# Patient Record
Sex: Male | Born: 1970 | Race: Black or African American | Hispanic: No | Marital: Single | Smoking: Never smoker
Health system: Southern US, Community
[De-identification: ages and names within clinical notes are randomized; demographics above are authoritative.]

## PROBLEM LIST (undated history)

## (undated) DIAGNOSIS — I10 Essential (primary) hypertension: Secondary | ICD-10-CM

---

## 2019-02-19 ENCOUNTER — Other Ambulatory Visit: Payer: Self-pay

## 2019-02-19 ENCOUNTER — Emergency Department: Payer: Medicaid Other

## 2019-02-19 ENCOUNTER — Emergency Department
Admission: EM | Admit: 2019-02-19 | Discharge: 2019-02-19 | Disposition: A | Discharge status: Home / Self Care | Payer: Medicaid Other | Attending: Student | Admitting: Student

## 2019-02-19 DIAGNOSIS — I10 Essential (primary) hypertension: Secondary | ICD-10-CM | POA: Insufficient documentation

## 2019-02-19 DIAGNOSIS — M545 Low back pain, unspecified: Secondary | ICD-10-CM

## 2019-02-19 HISTORY — DX: Essential (primary) hypertension: I10

## 2019-02-19 LAB — URINALYSIS, COMPLETE (UACMP) WITH MICROSCOPIC
Bacteria, UA: NONE SEEN
Bilirubin Urine: NEGATIVE
Glucose, UA: NEGATIVE mg/dL
Hgb urine dipstick: NEGATIVE
Ketones, ur: NEGATIVE mg/dL
Leukocytes,Ua: NEGATIVE
Nitrite: NEGATIVE
Protein, ur: NEGATIVE mg/dL
Specific Gravity, Urine: 1.025 (ref 1.005–1.030)
Squamous Epithelial / HPF: NONE SEEN (ref 0–5)
pH: 7 (ref 5.0–8.0)

## 2019-02-19 MED ORDER — MELOXICAM 15 MG PO TABS: 15.0000 mg | ORAL_TABLET | Freq: Every day | ORAL | 2 refills | Status: AC

## 2019-02-19 MED ORDER — BACLOFEN 10 MG PO TABS: 10.0000 mg | ORAL_TABLET | Freq: Every day | ORAL | 1 refills | Status: AC

## 2019-02-19 MED ORDER — CYCLOBENZAPRINE HCL 10 MG PO TABS
10.0000 mg | ORAL_TABLET | Freq: Once | ORAL | Status: AC
  Administered 2019-02-19: 10 mg via ORAL
  Filled 2019-02-19: qty 1

## 2019-02-19 MED ORDER — IBUPROFEN 600 MG PO TABS
600.0000 mg | ORAL_TABLET | Freq: Once | ORAL | Status: AC
  Administered 2019-02-19: 600 mg via ORAL
  Filled 2019-02-19: qty 1

## 2019-02-19 NOTE — ED Provider Notes (Signed)
[facility] Emergency Department Provider Note  ____________________________________________   First MD Initiated Contact with Patient 02/19/19 1013     (approximate)  I have reviewed the triage vital signs and the nursing notes.   HISTORY  Chief Complaint Back Pain    HPI Duane Cook is a 48 y.o. male presents emergency department from a group home.  Group home dropped him off and representative was called by the nursing staff.  They state he has been complaining of back pain and keeps rubbing at his kidney area.  They have been giving him Tylenol for pain as needed but is not helping.  The patient himself shows me that he hurts in his lower back and right flank area.  No fever or chills.  No vomiting or diarrhea.  Unsure of how long symptoms have been present patient states it has been approximately 3 months.    Past Medical History:  Diagnosis Date  . Hypertension     There are no active problems to display for this patient.   History reviewed. No pertinent surgical history.  Prior to Admission medications   Medication Sig Start Date End Date Taking? Authorizing Provider  benztropine (COGENTIN) 1 MG tablet Take 1 mg by mouth 2 (two) times daily.   Yes [provider]  divalproex (DEPAKOTE) 500 MG DR tablet Take 500 mg by mouth 3 (three) times daily.   Yes [provider]  docusate sodium (COLACE) 100 MG capsule Take 100 mg by mouth 2 (two) times daily.   Yes [provider]  haloperidol (HALDOL) 10 MG tablet Take 10 mg by mouth 4 (four) times daily.   Yes [provider]  traZODone (DESYREL) 100 MG tablet Take 100 mg by mouth at bedtime.   Yes [provider]  baclofen (LIORESAL) 10 MG tablet Take 1 tablet (10 mg total) by mouth daily. Prn muscle spasm 02/19/19 02/19/20  Caryn Section Linden Dolin, PA-C  meloxicam (MOBIC) 15 MG tablet Take 1 tablet (15 mg total) by mouth daily. Prn back pain 02/19/19 02/19/20   Caryn Section Linden Dolin, PA-C    Allergies Patient has no known allergies.  No family history on file.  Social History Social History   Tobacco Use  . Smoking status: Never Smoker  . Smokeless tobacco: Never Used  Substance Use Topics  . Alcohol use: Not Currently  . Drug use: Not Currently    Review of Systems  Constitutional: No fever/chills Eyes: No visual changes. ENT: No sore throat. Respiratory: Denies cough Genitourinary: Negative for dysuria. Musculoskeletal: Positive for back pain. Skin: Negative for rash.    ____________________________________________   PHYSICAL EXAM:  VITAL SIGNS: ED Triage Vitals  Enc Vitals Group     BP 02/19/19 0936 (!) 154/82     Pulse Rate 02/19/19 0936 99     Resp 02/19/19 0936 17     Temp 02/19/19 0936 98.3 F (36.8 C)     Temp Source 02/19/19 0936 Oral     SpO2 02/19/19 0936 98 %     Weight 02/19/19 0937 206 lb (93.4 kg)     Height 02/19/19 0937 6' (1.829 m)     Head Circumference --      Peak Flow --      Pain Score 02/19/19 0937 10     Pain Loc --      Pain Edu? --      Excl. in [place]? --     Constitutional: Alert and oriented. Well appearing and  in no acute distress. Eyes: Conjunctivae are normal.  Head: Atraumatic. Nose: No congestion/rhinnorhea. Mouth/Throat: Mucous membranes are moist.   Neck:  supple no lymphadenopathy noted Cardiovascular: Normal rate, regular rhythm. Heart sounds are normal Respiratory: Normal respiratory effort.  No retractions, lungs c t a  Abd: soft nontender bs normal all 4 quad, right-sided CVA tenderness GU: deferred Musculoskeletal: FROM all extremities, warm and well perfused, paravertebral muscles of the lumbar spine are tender, spiny processes are mildly tender, neurovascular is intact, patient is able stand and walk without difficulty Neurologic:  Normal speech and language.  Skin:  Skin is warm, dry and intact. No rash noted. Psychiatric: Mood and affect are normal. Speech and behavior  are normal.  ____________________________________________   LABS (all labs ordered are listed, but only abnormal results are displayed)  Labs Reviewed  URINALYSIS, COMPLETE (UACMP) WITH MICROSCOPIC - Abnormal; Notable for the following components:      Result Value   Color, Urine YELLOW (*)    APPearance CLEAR (*)    All other components within normal limits   ____________________________________________   ____________________________________________  RADIOLOGY  X-ray of the lumbar spine is negative for fracture or disc narrowing  ____________________________________________   PROCEDURES  Procedure(s) performed: Ibuprofen 600 mg p.o., Flexeril 10 mg p.o.   Procedures    ____________________________________________   INITIAL IMPRESSION / ASSESSMENT AND PLAN / ED COURSE  Pertinent labs & imaging results that were available during my care of the patient were reviewed by me and considered in my medical decision making (see chart for details).   Patient is 48 year old male presents emergency department with back pain.  See HPI  Physical exam shows the right flank area to be tender, lumbar spines mildly tender.  UA ordered X-ray of the lumbar spine is negative for any acute abnormalities.  Urinalysis is normal  Explained the findings to the patient.  He was given prescription for meloxicam and baclofen.  He is to follow-up with orthopedics if not better in 1 week.  Return emergency department worsening.  Information was given to the group home.  He was discharged stable condition.    Duane Cook was evaluated in Emergency Department on 02/19/2019 for the symptoms described in the history of present illness. He was evaluated in the context of the global COVID-19 pandemic, which necessitated consideration that the patient might be at risk for infection with the SARS-CoV-2 virus that causes COVID-19. Institutional protocols and algorithms that pertain to the evaluation of  patients at risk for COVID-19 are in a state of rapid change based on information released by regulatory bodies including the CDC and federal and state organizations. These policies and algorithms were followed during the patient's care in the ED.   As part of my medical decision making, I reviewed the following data within the electronic MEDICAL RECORD NUMBER Nursing notes reviewed and incorporated, Labs reviewed UA normal, Old chart reviewed, Radiograph reviewed x-ray lumbar spine is negative, Notes from prior ED visits and [place] Controlled Substance Database  ____________________________________________   FINAL CLINICAL IMPRESSION(S) / ED DIAGNOSES  Final diagnoses:  Acute midline low back pain without sciatica      NEW MEDICATIONS STARTED DURING THIS VISIT:  Discharge Medication List as of 02/19/2019 11:38 AM    START taking these medications   Details  baclofen (LIORESAL) 10 MG tablet Take 1 tablet (10 mg total) by mouth daily. Prn muscle spasm, Starting Mon 02/19/2019, Until Tue 02/19/2020, Normal    meloxicam (MOBIC) 15 MG tablet  Take 1 tablet (15 mg total) by mouth daily. Prn back pain, Starting Mon 02/19/2019, Until Tue 02/19/2020, Normal         Note:  This document was prepared using Dragon voice recognition software and may include unintentional dictation errors.    Faythe GheeFisher, Wyley Hack W, PA-C 02/19/19 1212    Miguel AschoffMonks, Sarah L., MD 02/19/19 812 [phone]

## 2019-02-19 NOTE — ED Triage Notes (Signed)
Pt brought in by caregiver at group home, states the pt is on tylenol PRN for back pain but reports it is getting worse.

## 2019-02-19 NOTE — Discharge Instructions (Addendum)
Follow-up with your regular doctor or Dr. Sabra Heck if your pain in your back is not better in 1 week.  Use the medication as prescribed.  Wet heat to the muscles in the back.  Ice along the spine.  Based arm things that will help your back pain.  When sleeping put a pillow between your legs if you are lying on your side.  Put a pillow underneath her knees if on your back.  Try to do stretches.

## 2019-02-19 NOTE — ED Notes (Signed)
See triage note  Presents with lower back pain and right leg pain  States he is also having pain to flank areas  Pain has been there for about 3 months   Denies any injury  Ambulates well to treatment area

## 2020-04-07 IMAGING — CR DG LUMBAR SPINE 2-3V
1 series · 3 of 3 positions shown · non-contrast
Comparison: None.

CLINICAL DATA: Pain in lower back and right leg for 3 months

EXAM:
LUMBAR SPINE - 2-3 VIEW

[Series 1: dg lumbar spine 2-3 views · 0.14mm/px · 3 of 3 slices shown]
[im 1/3]
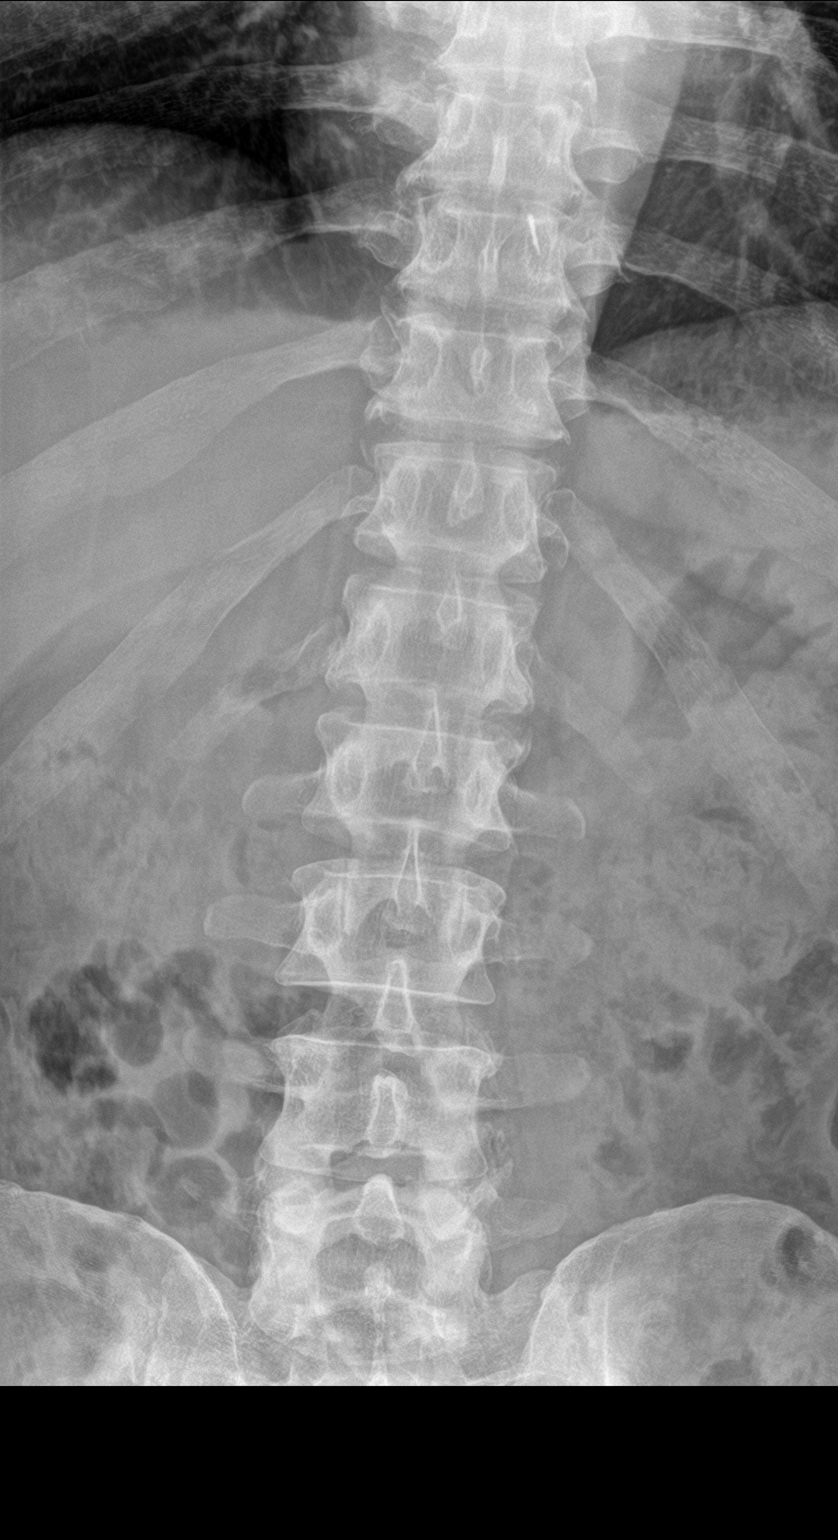
[im 2/3]
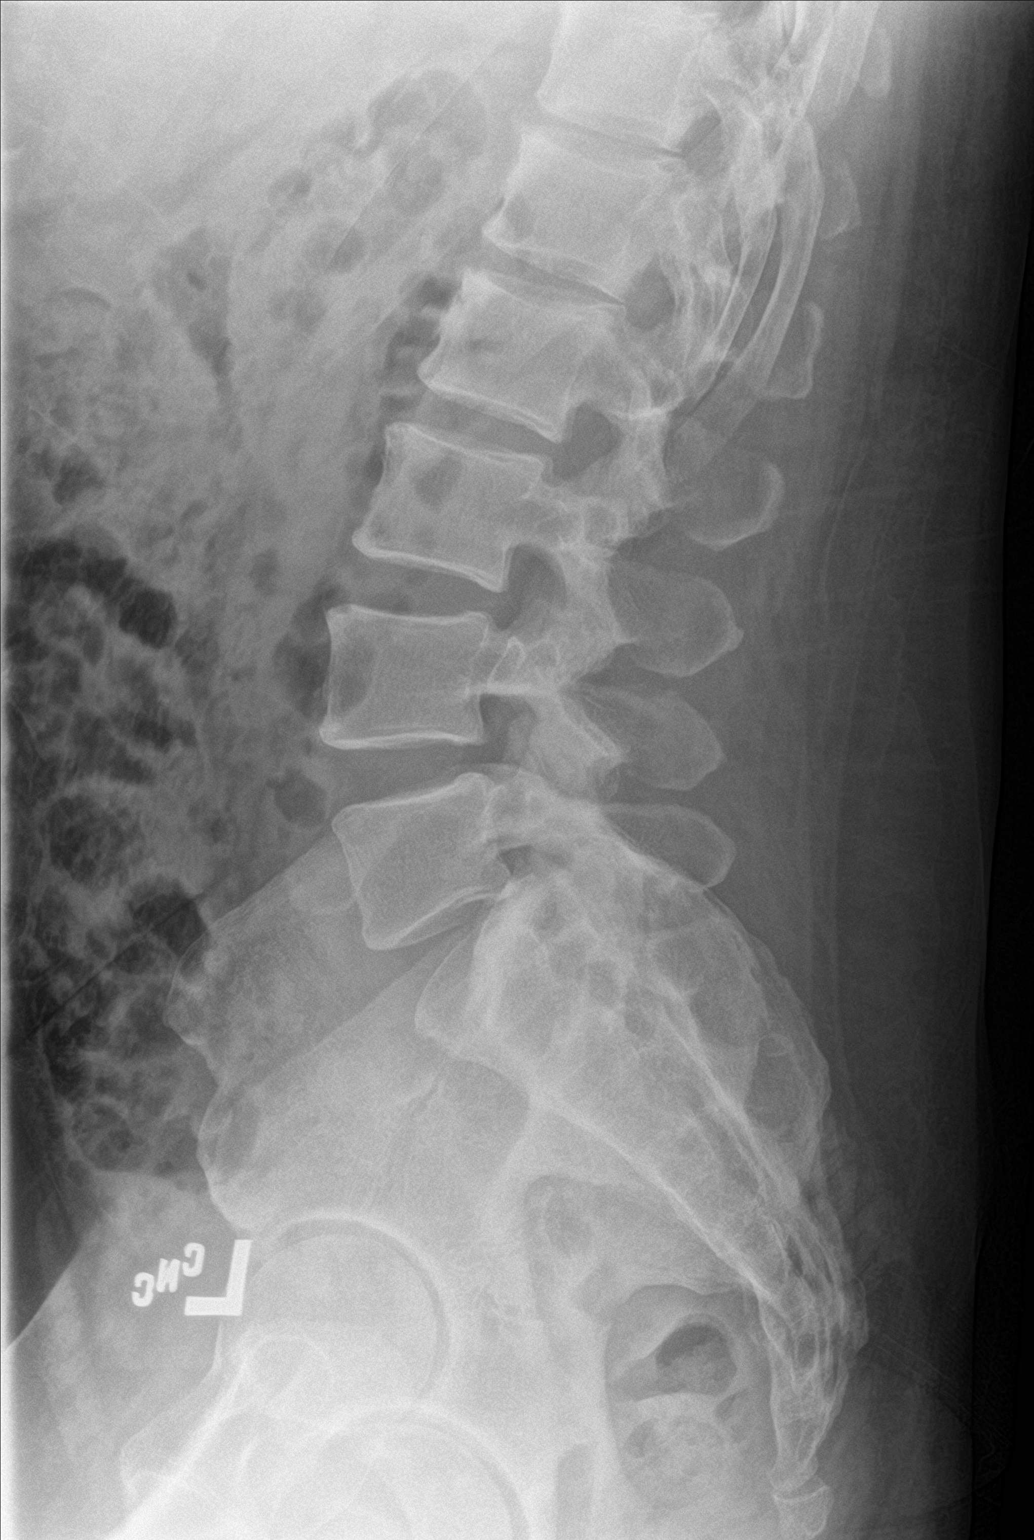
[im 3/3]
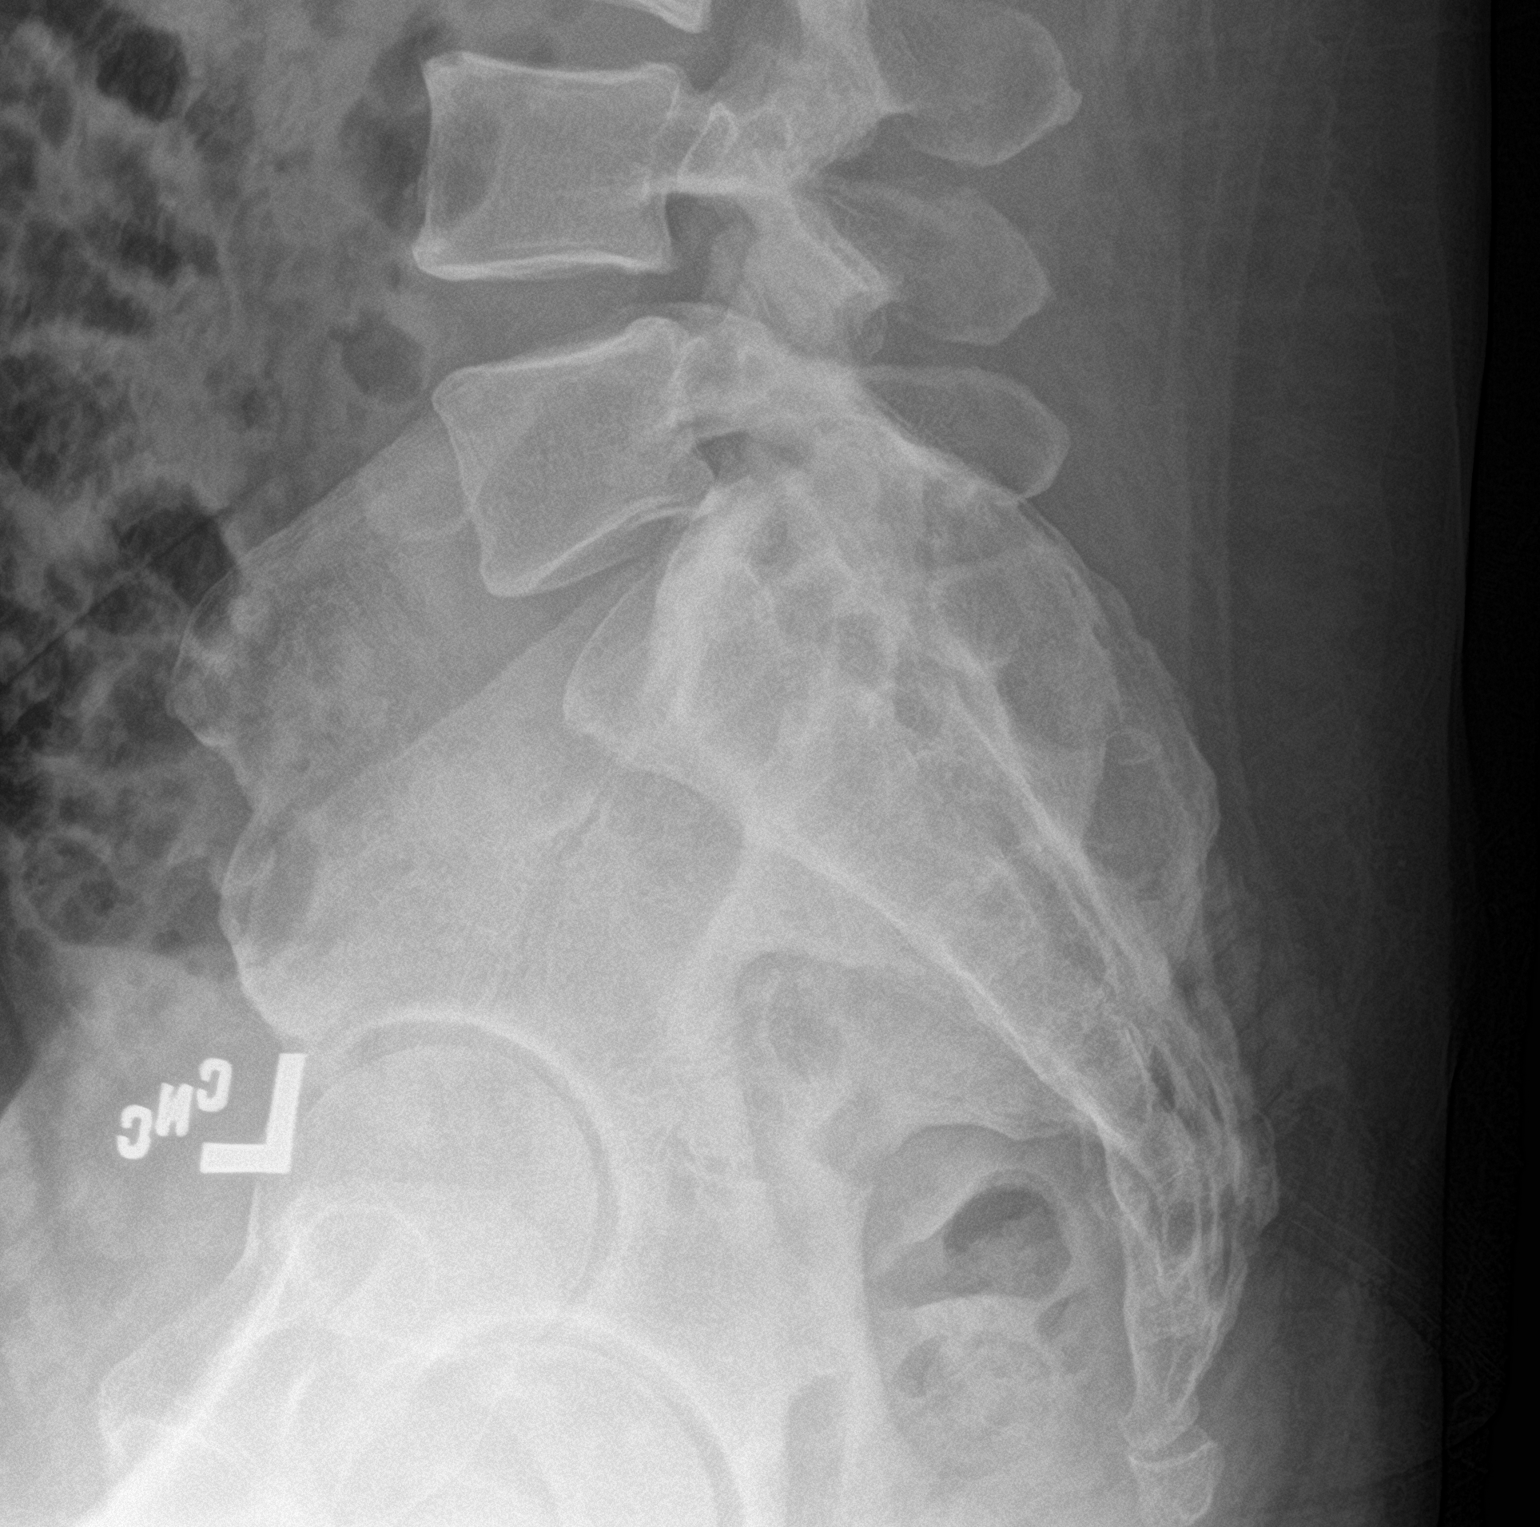

[3 of 3 positions shown; findings below may reference images not displayed]

FINDINGS: Four lumbar type vertebrae, L1 is likely rib-bearing. Spondylitic
lower thoracic endplate spurring. Negative for fracture or visible
erosion/lesion. No lumbar disc narrowing or notable spurring. No
listhesis.
IMPRESSION: No acute finding or lumbar disc narrowing.

## 2022-10-12 ENCOUNTER — Emergency Department
Admission: EM | Admit: 2022-10-12 | Discharge: 2022-10-12 | Disposition: A | Discharge status: Home / Self Care | Payer: Medicaid Other | Attending: Emergency Medicine | Admitting: Emergency Medicine

## 2022-10-12 ENCOUNTER — Emergency Department: Payer: Medicaid Other

## 2022-10-12 DIAGNOSIS — R9431 Abnormal electrocardiogram [ECG] [EKG]: Secondary | ICD-10-CM | POA: Diagnosis present

## 2022-10-12 DIAGNOSIS — D649 Anemia, unspecified: Secondary | ICD-10-CM | POA: Diagnosis not present

## 2022-10-12 LAB — CBC
HCT: 38.8 % — ABNORMAL LOW (ref 39.0–52.0)
Hemoglobin: 12.4 g/dL — ABNORMAL LOW (ref 13.0–17.0)
MCH: 29.5 pg (ref 26.0–34.0)
MCHC: 32 g/dL (ref 30.0–36.0)
MCV: 92.2 fL (ref 80.0–100.0)
Platelets: 165 10*3/uL (ref 150–400)
RBC: 4.21 MIL/uL — ABNORMAL LOW (ref 4.22–5.81)
RDW: 14.7 % (ref 11.5–15.5)
WBC: 4.9 10*3/uL (ref 4.0–10.5)
nRBC: 0 % (ref 0.0–0.2)

## 2022-10-12 LAB — BASIC METABOLIC PANEL
Anion gap: 7 (ref 5–15)
BUN: 11 mg/dL (ref 6–20)
CO2: 30 mmol/L (ref 22–32)
Calcium: 9.1 mg/dL (ref 8.9–10.3)
Chloride: 100 mmol/L (ref 98–111)
Creatinine, Ser: 0.84 mg/dL (ref 0.61–1.24)
GFR, Estimated: >60 mL/min (ref >60)
Glucose, Bld: 90 mg/dL (ref 70–99)
Potassium: 3.9 mmol/L (ref 3.5–5.1)
Sodium: 137 mmol/L (ref 135–145)

## 2022-10-12 LAB — TROPONIN I (HIGH SENSITIVITY): Troponin I (High Sensitivity): 3 ng/L (ref <18)

## 2022-10-12 NOTE — ED Triage Notes (Signed)
Pt presents to the ED due to possible stemi. Pt was seen at his PCP today and had an EKG completed. PCP was concerned of possible STEMI. Pt denies CP at the moment.

## 2022-10-12 NOTE — Discharge Instructions (Addendum)
You were seen in the emergency department today for evaluation of an abnormal EKG.  Your EKG did have some abnormal features, but your blood work including a troponin was reassuring.  We did review the case with her cardiologist.  Since you are not having any chest pain or shortness of breath, we do feel that you can be safely discharged.  Your blood work did show that your hemoglobin level is slightly low at 12.8.  Please follow-up with your primary care doctor for further evaluation of this.  If you do develop chest pain, shortness of breath, or any other new or concerning symptoms, please return to the ER for further evaluation.

## 2022-10-12 NOTE — ED Provider Notes (Signed)
[facility] Provider Note    Event Date/Time   First MD Initiated Contact with Patient 10/12/22 1545     (approximate)   History   Chest Pain   HPI  Duane Cook is a 52 y.o. male presenting to the emergency department for evaluation of abnormal EKG.  Patient resides in a group home.  He had his routine physical today.  He does take multiple psychiatric medicines.  He did have an EKG performed.  This did demonstrate abnormal ST changes.  Patient report any chest pain prior to the EKG being performed.  He denies any chest pain, shortness of breath to me.  No known cardiac history.  I did speak with the caregiver from the group home who also states that he has not heard the patient complaining of chest pain and does report that he has been behaving at his baseline.      Physical Exam   Triage Vital Signs: ED Triage Vitals  Enc Vitals Group     BP 10/12/22 1539 (!) 153/97     Pulse Rate 10/12/22 1539 86     Resp 10/12/22 1539 18     Temp 10/12/22 1539 98.3 F (36.8 C)     Temp Source 10/12/22 1539 Oral     SpO2 10/12/22 1539 99 %     Weight 10/12/22 1540 190 lb 4.8 oz (86.3 kg)     Height --      Head Circumference --      Peak Flow --      Pain Score 10/12/22 1539 0     Pain Loc --      Pain Edu? --      Excl. in [place]? --     Most recent vital signs: Vitals:   10/12/22 1539  BP: (!) 153/97  Pulse: 86  Resp: 18  Temp: 98.3 F (36.8 C)  SpO2: 99%     General: Awake, interactive  CV:  Regular rate, good peripheral perfusion.  Resp:  Lungs clear, unlabored respirations.  Abd:  Soft, nondistended.  Neuro:  Symmetric facial movement, fluid speech   ED Results / Procedures / Treatments   Labs (all labs ordered are listed, but only abnormal results are displayed) Labs Reviewed  CBC - Abnormal; Notable for the following components:      Result Value   RBC 4.21 (*)    Hemoglobin 12.4 (*)    HCT 38.8 (*)    All other components within  normal limits  BASIC METABOLIC PANEL  TROPONIN I (HIGH SENSITIVITY)     EKG EKG independently reviewed interpreted by myself (ER attending) demonstrates:  EKG at 1545 demonstrates sinus rhythm at a rate of 81, PR 139, QRS 84, QTc 411.  Minimal ST elevation noted in 1 and aVL with small complex QRS, slight ST elevation noted in lead V2 as well. Repeat EKG at 1650 demonstrates sinus rhythm at a rate of 69, PR 126, QRS 79, QTc 400, redemonstration of slight ST elevation in multiple leads, not significantly changed from prior.  RADIOLOGY Imaging independently reviewed and interpreted by myself demonstrates:    PROCEDURES:  Critical Care performed: No  Procedures   MEDICATIONS ORDERED IN ED: Medications - No data to display   IMPRESSION / MDM / ASSESSMENT AND PLAN / ED COURSE  I reviewed the triage vital signs and the nursing notes.  Differential diagnosis includes, but is not limited to, normal EKG variance, acute ischemia, prior infarct  Patient's presentation  is most consistent with acute presentation with potential threat to life or bodily function.  52 year old male presenting with abnormal EKG without active chest pain. Workup here demonstrates CBC with mild anemia with hemoglobin of 12.4  no recent prior in our system for comparison.  No reported active bleeding sources.  BMP without significant derangement.  Troponin negative.  Chest x-Markus Casten without acute findings.  Clinical Course as of 10/12/22 1704  Tue Oct 12, 2022  1656 Lab work reassuring.  I did review the patient's EKG and clinical presentation with Dr. Okey Dupre with cardiology.  He did not feel that the patient required further ischemic workup at this time based on his presentation.  Discussed results with patient and caregiver who are comfortable with plan for discharge.  They will arrange outpatient follow-up for further evaluation of his mild anemia. Strict return precautions provided.  Patient discharged in stable  condition.  [NR]    Clinical Course User Index [NR] Trinna Post, MD     FINAL CLINICAL IMPRESSION(S) / ED DIAGNOSES   Final diagnoses:  Abnormal EKG  Anemia, unspecified type     Rx / DC Orders   ED Discharge Orders     None        Note:  This document was prepared using Dragon voice recognition software and may include unintentional dictation errors.   Trinna Post, MD 10/13/22 [phone]
